# Patient Record
Sex: Male | Born: 2000 | Race: Black or African American | Hispanic: No | Marital: Single | Smoking: Never smoker
Health system: Southern US, Community
[De-identification: ages and names within clinical notes are randomized; demographics above are authoritative.]

---
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
## 2023-10-26 ENCOUNTER — Emergency Department (HOSPITAL_COMMUNITY): Payer: MEDICAID

## 2023-10-26 ENCOUNTER — Inpatient Hospital Stay (HOSPITAL_COMMUNITY): Payer: MEDICAID

## 2023-10-26 ENCOUNTER — Inpatient Hospital Stay (HOSPITAL_COMMUNITY)
Admission: EM | Admit: 2023-10-26 | Discharge: 2023-11-10 | DRG: 604 | Disposition: E | Payer: MEDICAID | Attending: General Surgery | Admitting: General Surgery

## 2023-10-26 DIAGNOSIS — R000 Tachycardia, unspecified: Secondary | ICD-10-CM | POA: Diagnosis present

## 2023-10-26 DIAGNOSIS — R402212 Coma scale, best verbal response, none, at arrival to emergency department: Secondary | ICD-10-CM | POA: Diagnosis present

## 2023-10-26 DIAGNOSIS — W3400XA Accidental discharge from unspecified firearms or gun, initial encounter: Secondary | ICD-10-CM

## 2023-10-26 DIAGNOSIS — S0193XA Puncture wound without foreign body of unspecified part of head, initial encounter: Principal | ICD-10-CM | POA: Diagnosis present

## 2023-10-26 DIAGNOSIS — Z66 Do not resuscitate: Secondary | ICD-10-CM | POA: Diagnosis present

## 2023-10-26 DIAGNOSIS — R402312 Coma scale, best motor response, none, at arrival to emergency department: Secondary | ICD-10-CM | POA: Diagnosis present

## 2023-10-26 DIAGNOSIS — G935 Compression of brain: Secondary | ICD-10-CM | POA: Diagnosis present

## 2023-10-26 DIAGNOSIS — R402431 Glasgow coma scale score 3-8, in the field [EMT or ambulance]: Secondary | ICD-10-CM

## 2023-10-26 DIAGNOSIS — I959 Hypotension, unspecified: Secondary | ICD-10-CM | POA: Diagnosis present

## 2023-10-26 DIAGNOSIS — Z515 Encounter for palliative care: Secondary | ICD-10-CM

## 2023-10-26 DIAGNOSIS — R0682 Tachypnea, not elsewhere classified: Secondary | ICD-10-CM | POA: Diagnosis present

## 2023-10-26 DIAGNOSIS — R569 Unspecified convulsions: Secondary | ICD-10-CM | POA: Diagnosis present

## 2023-10-26 DIAGNOSIS — R402112 Coma scale, eyes open, never, at arrival to emergency department: Secondary | ICD-10-CM | POA: Diagnosis present

## 2023-10-26 DIAGNOSIS — E872 Acidosis, unspecified: Secondary | ICD-10-CM | POA: Diagnosis present

## 2023-10-26 LAB — I-STAT ARTERIAL BLOOD GAS, ED
Acid-base deficit: 9 mmol/L — ABNORMAL HIGH (ref 0.0–2.0)
Bicarbonate: 16.8 mmol/L — ABNORMAL LOW (ref 20.0–28.0)
Calcium, Ion: 1.08 mmol/L — ABNORMAL LOW (ref 1.15–1.40)
HCT: 24 % — ABNORMAL LOW (ref 39.0–52.0)
Hemoglobin: 8.2 g/dL — ABNORMAL LOW (ref 13.0–17.0)
O2 Saturation: 100 %
Patient temperature: 99
Potassium: 3.4 mmol/L — ABNORMAL LOW (ref 3.5–5.1)
Sodium: 140 mmol/L (ref 135–145)
TCO2: 18 mmol/L — ABNORMAL LOW (ref 22–32)
pCO2 arterial: 37 mmHg (ref 32–48)
pH, Arterial: 7.266 — ABNORMAL LOW (ref 7.35–7.45)
pO2, Arterial: 242 mmHg — ABNORMAL HIGH (ref 83–108)

## 2023-10-26 LAB — COMPREHENSIVE METABOLIC PANEL WITH GFR
ALT: 18 U/L (ref 0–44)
AST: 32 U/L (ref 15–41)
Albumin: 3.1 g/dL — ABNORMAL LOW (ref 3.5–5.0)
Alkaline Phosphatase: 37 U/L — ABNORMAL LOW (ref 38–126)
Anion gap: 16 — ABNORMAL HIGH (ref 5–15)
BUN: 9 mg/dL (ref 6–20)
CO2: 18 mmol/L — ABNORMAL LOW (ref 22–32)
Calcium: 8.3 mg/dL — ABNORMAL LOW (ref 8.9–10.3)
Chloride: 104 mmol/L (ref 98–111)
Creatinine, Ser: 1.45 mg/dL — ABNORMAL HIGH (ref 0.61–1.24)
GFR, Estimated: >60 mL/min (ref >60)
Glucose, Bld: 197 mg/dL — ABNORMAL HIGH (ref 70–99)
Potassium: 2.8 mmol/L — ABNORMAL LOW (ref 3.5–5.1)
Sodium: 138 mmol/L (ref 135–145)
Total Bilirubin: 0.8 mg/dL (ref 0.0–1.2)
Total Protein: 5.4 g/dL — ABNORMAL LOW (ref 6.5–8.1)

## 2023-10-26 LAB — CBC
HCT: 23.6 % — ABNORMAL LOW (ref 39.0–52.0)
HCT: 37.3 % — ABNORMAL LOW (ref 39.0–52.0)
Hemoglobin: 11.4 g/dL — ABNORMAL LOW (ref 13.0–17.0)
Hemoglobin: 7.1 g/dL — ABNORMAL LOW (ref 13.0–17.0)
MCH: 28.4 pg (ref 26.0–34.0)
MCH: 28.7 pg (ref 26.0–34.0)
MCHC: 30.1 g/dL (ref 30.0–36.0)
MCHC: 30.6 g/dL (ref 30.0–36.0)
MCV: 93 fL (ref 80.0–100.0)
MCV: 95.5 fL (ref 80.0–100.0)
Platelets: 167 K/uL (ref 150–400)
Platelets: 202 K/uL (ref 150–400)
RBC: 2.47 MIL/uL — ABNORMAL LOW (ref 4.22–5.81)
RBC: 4.01 MIL/uL — ABNORMAL LOW (ref 4.22–5.81)
RDW: 12.3 % (ref 11.5–15.5)
RDW: 12.3 % (ref 11.5–15.5)
WBC: 14.5 K/uL — ABNORMAL HIGH (ref 4.0–10.5)
WBC: 9.6 K/uL (ref 4.0–10.5)
nRBC: 0.2 % (ref 0.0–0.2)
nRBC: 0.5 % — ABNORMAL HIGH (ref 0.0–0.2)

## 2023-10-26 LAB — BASIC METABOLIC PANEL WITH GFR
Anion gap: 13 (ref 5–15)
BUN: 9 mg/dL (ref 6–20)
CO2: 16 mmol/L — ABNORMAL LOW (ref 22–32)
Calcium: 7 mg/dL — ABNORMAL LOW (ref 8.9–10.3)
Chloride: 109 mmol/L (ref 98–111)
Creatinine, Ser: 1.91 mg/dL — ABNORMAL HIGH (ref 0.61–1.24)
GFR, Estimated: 50 mL/min — ABNORMAL LOW (ref >60)
Glucose, Bld: 299 mg/dL — ABNORMAL HIGH (ref 70–99)
Potassium: 2.9 mmol/L — ABNORMAL LOW (ref 3.5–5.1)
Sodium: 138 mmol/L (ref 135–145)

## 2023-10-26 LAB — URINALYSIS, ROUTINE W REFLEX MICROSCOPIC
Bilirubin Urine: NEGATIVE
Glucose, UA: NEGATIVE mg/dL
Hgb urine dipstick: NEGATIVE
Ketones, ur: NEGATIVE mg/dL
Leukocytes,Ua: NEGATIVE
Nitrite: NEGATIVE
Protein, ur: NEGATIVE mg/dL
Specific Gravity, Urine: 1.006 (ref 1.005–1.030)
pH: 7 (ref 5.0–8.0)

## 2023-10-26 LAB — I-STAT CHEM 8, ED
BUN: 7 mg/dL (ref 6–20)
Calcium, Ion: 1.08 mmol/L — ABNORMAL LOW (ref 1.15–1.40)
Chloride: 105 mmol/L (ref 98–111)
Creatinine, Ser: 1.6 mg/dL — ABNORMAL HIGH (ref 0.61–1.24)
Glucose, Bld: 194 mg/dL — ABNORMAL HIGH (ref 70–99)
HCT: 36 % — ABNORMAL LOW (ref 39.0–52.0)
Hemoglobin: 12.2 g/dL — ABNORMAL LOW (ref 13.0–17.0)
Potassium: 2.9 mmol/L — ABNORMAL LOW (ref 3.5–5.1)
Sodium: 143 mmol/L (ref 135–145)
TCO2: 20 mmol/L — ABNORMAL LOW (ref 22–32)

## 2023-10-26 LAB — SAMPLE TO BLOOD BANK

## 2023-10-26 LAB — I-STAT CG4 LACTIC ACID, ED: Lactic Acid, Venous: 8.6 mmol/L (ref 0.5–1.9)

## 2023-10-26 LAB — PROTIME-INR
INR: 1.4 — ABNORMAL HIGH (ref 0.8–1.2)
Prothrombin Time: 17.6 s — ABNORMAL HIGH (ref 11.4–15.2)

## 2023-10-26 LAB — HIV ANTIBODY (ROUTINE TESTING W REFLEX): HIV Screen 4th Generation wRfx: NONREACTIVE

## 2023-10-26 LAB — ETHANOL: Alcohol, Ethyl (B): 145 mg/dL — ABNORMAL HIGH (ref <15)

## 2023-10-26 MED ORDER — POTASSIUM CHLORIDE 20 MEQ PO PACK: 40.0000 meq | PACK | Freq: Once | ORAL | Status: DC

## 2023-10-26 MED ORDER — PROPOFOL 1000 MG/100ML IV EMUL
0.0000 ug/kg/min | INTRAVENOUS | Status: DC
  Administered 2023-10-26: 10 ug/kg/min via INTRAVENOUS

## 2023-10-26 MED ORDER — SODIUM CHLORIDE 0.9 % IV BOLUS
1000.0000 mL | Freq: Once | INTRAVENOUS | Status: AC
  Administered 2023-10-26: 1000 mL via INTRAVENOUS

## 2023-10-26 MED ORDER — POTASSIUM CHLORIDE 10 MEQ/100ML IV SOLN: 10.0000 meq | INTRAVENOUS | Status: AC

## 2023-10-26 MED ORDER — PHENYLEPHRINE HCL-NACL 20-0.9 MG/250ML-% IV SOLN
INTRAVENOUS | Status: AC
  Administered 2023-10-26: 20 ug/min via INTRAVENOUS
  Filled 2023-10-26: qty 250

## 2023-10-26 MED ORDER — ONDANSETRON HCL 4 MG/2ML IJ SOLN: 4.0000 mg | Freq: Four times a day (QID) | INTRAMUSCULAR | Status: DC | PRN

## 2023-10-26 MED ORDER — DOCUSATE SODIUM 100 MG PO CAPS: 100.0000 mg | ORAL_CAPSULE | Freq: Two times a day (BID) | ORAL | Status: DC

## 2023-10-26 MED ORDER — HYDRALAZINE HCL 20 MG/ML IJ SOLN: 10.0000 mg | INTRAMUSCULAR | Status: DC | PRN

## 2023-10-26 MED ORDER — POLYETHYLENE GLYCOL 3350 17 G PO PACK: 17.0000 g | PACK | Freq: Every day | ORAL | Status: DC

## 2023-10-26 MED ORDER — POLYETHYLENE GLYCOL 3350 17 G PO PACK: 17.0000 g | PACK | Freq: Every day | ORAL | Status: DC | PRN

## 2023-10-26 MED ORDER — NOREPINEPHRINE 4 MG/250ML-% IV SOLN
INTRAVENOUS | Status: AC
  Filled 2023-10-26: qty 250

## 2023-10-26 MED ORDER — SUCCINYLCHOLINE CHLORIDE 20 MG/ML IJ SOLN
INTRAMUSCULAR | Status: AC | PRN
  Administered 2023-10-26: 80 mg via INTRAVENOUS

## 2023-10-26 MED ORDER — PROPOFOL 1000 MG/100ML IV EMUL
INTRAVENOUS | Status: AC
  Filled 2023-10-26: qty 100

## 2023-10-26 MED ORDER — DOCUSATE SODIUM 50 MG/5ML PO LIQD: 100.0000 mg | Freq: Two times a day (BID) | ORAL | Status: DC

## 2023-10-26 MED ORDER — ACETAMINOPHEN 500 MG PO TABS: 1000.0000 mg | ORAL_TABLET | Freq: Four times a day (QID) | ORAL | Status: DC

## 2023-10-26 MED ORDER — FENTANYL CITRATE PF 50 MCG/ML IJ SOSY
25.0000 ug | PREFILLED_SYRINGE | Freq: Once | INTRAMUSCULAR | Status: AC
  Administered 2023-10-26: 25 ug via INTRAVENOUS

## 2023-10-26 MED ORDER — ONDANSETRON 4 MG PO TBDP: 4.0000 mg | ORAL_TABLET | Freq: Four times a day (QID) | ORAL | Status: DC | PRN

## 2023-10-26 MED ORDER — METOPROLOL TARTRATE 5 MG/5ML IV SOLN: 5.0000 mg | Freq: Four times a day (QID) | INTRAVENOUS | Status: DC | PRN

## 2023-10-26 MED ORDER — SODIUM CHLORIDE 0.9 % IV SOLN: INTRAVENOUS | Status: DC

## 2023-10-26 MED ORDER — FENTANYL CITRATE PF 50 MCG/ML IJ SOSY: 100.0000 ug | PREFILLED_SYRINGE | Freq: Once | INTRAMUSCULAR | Status: DC

## 2023-10-26 MED ORDER — METHOCARBAMOL 500 MG PO TABS: 500.0000 mg | ORAL_TABLET | Freq: Three times a day (TID) | ORAL | Status: DC

## 2023-10-26 MED ORDER — FENTANYL BOLUS VIA INFUSION: 25.0000 ug | INTRAVENOUS | Status: DC | PRN

## 2023-10-26 MED ORDER — METHOCARBAMOL 1000 MG/10ML IJ SOLN: 500.0000 mg | Freq: Three times a day (TID) | INTRAMUSCULAR | Status: DC

## 2023-10-26 MED ORDER — FENTANYL 2500MCG IN NS 250ML (10MCG/ML) PREMIX INFUSION
0.0000 ug/h | INTRAVENOUS | Status: DC
  Administered 2023-10-26: 25 ug/h via INTRAVENOUS
  Filled 2023-10-26: qty 250

## 2023-10-26 MED ORDER — ETOMIDATE 2 MG/ML IV SOLN
INTRAVENOUS | Status: AC | PRN
  Administered 2023-10-26: 20 mg via INTRAVENOUS

## 2023-10-26 MED ORDER — SODIUM BICARBONATE 8.4 % IV SOLN
INTRAVENOUS | Status: DC
  Filled 2023-10-26: qty 150

## 2023-10-26 MED ORDER — LEVETIRACETAM (KEPPRA) 500 MG/5 ML ADULT IV PUSH
2000.0000 mg | Freq: Once | INTRAVENOUS | Status: AC
  Administered 2023-10-26: 2000 mg via INTRAVENOUS

## 2023-10-26 MED ORDER — PHENYLEPHRINE HCL-NACL 20-0.9 MG/250ML-% IV SOLN: 0.0000 ug/min | INTRAVENOUS | Status: DC

## 2023-10-26 MED ORDER — LACTATED RINGERS IV BOLUS
1000.0000 mL | Freq: Once | INTRAVENOUS | Status: AC
Start: 2023-10-26 — End: 2023-10-26
  Administered 2023-10-26: 1000 mL via INTRAVENOUS
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
## 2023-11-10 NOTE — ED Notes (Signed)
 Family at bedside.
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
## 2023-11-10 NOTE — ED Notes (Signed)
 Pt intubated by Dr. Melvenia, 26 at the lip.

## 2023-11-10 NOTE — ED Notes (Signed)
Pt has returned from CT without incident 
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
## 2023-11-10 NOTE — Progress Notes (Signed)
..  Trauma Event Note    Reason for Call :  Adoptive mother Zane Kitty [phone] [email or web address], provided documentation where she obtained guardianship and adopted pt after his grandmother and parents passed away. Hard copies in chart.    Last imported Vital Signs BP (!) 34/25   Pulse (!) 126   Temp 97.7 F (36.5 C)   Resp (!) 0   Ht 6' 2 (1.88 m)   Wt 165 lb 5.5 oz (75 kg)   SpO2 (!) 88%   BMI 21.23 kg/m   Trending CBC Recent Labs    10/12/2023 0026 10/17/2023 0031 11/08/2023 0154 11/06/2023 0319  WBC 9.6  --   --  14.5*  HGB 11.4* 12.2* 8.2* 7.1*  HCT 37.3* 36.0* 24.0* 23.6*  PLT 202  --   --  167    Trending Coag's Recent Labs    11/09/2023 0026  INR 1.4*    Trending BMET Recent Labs    10/30/2023 0026 10/22/2023 0031 11/04/2023 0154 11/05/2023 0319  NA 138 143 140 138  K 2.8* 2.9* 3.4* 2.9*  CL 104 105  --  109  CO2 18*  --   --  16*  BUN 9 7  --  9  CREATININE 1.45* 1.60*  --  1.91*  GLUCOSE 197* 194*  --  299*      Yuan Gann Dee  Trauma Response RN  Please call TRN at [phone] for further assistance.
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
## 2023-11-10 NOTE — ED Notes (Signed)
 Patient transported to CT

## 2023-11-10 NOTE — Progress Notes (Signed)
 Patient ID: Victor [name], male   DOB: December 07, 2000, 23 y.o.   MRN: [id number] We stopped by to see the patient.  He is GCS 3, pupils 5 mm and irregular and nonreactive, he is on fentanyl  and Neo-Synephrine.  Pressure 60/30.  I reviewed his CT of the head once again.  I simply do not think there is any surgical treatment that would be indicated as I think all treatment is futile.  I think this is not a survivable injury.

## 2023-11-10 NOTE — Progress Notes (Signed)
 Orthopedic Tech Progress Note Patient Details:  [name] December 01, 2000 [id number]  Patient ID: Victor [name], male   DOB: 03/19/2000, 23 y.o.   MRN: [id number] I attended trauma page. Chandra Dorn PARAS 11/09/2023, 12:50 AM

## 2023-11-10 NOTE — ED Notes (Signed)
 Brown paper bags placed and wrapped around each hand.

## 2023-11-10 NOTE — Progress Notes (Signed)
 [facility] Ref completed Ref [id number] Dorothe Pouch
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
## 2023-11-10 NOTE — Progress Notes (Signed)
   10/19/2023 0030  Vent Select  $ Ventilator Initial/Subsequent  Initial  Invasive or Noninvasive Invasive  Adult Vent Y  Vent start date 10/20/2023  Vent start time 0030  Airway 7.5 mm  Placement Date/Time: 10/13/2023 0030   Grade View: Grade 3  Placed By: ED Physician  ETT Types: Endobronchial;Oral  Size (mm): 7.5 mm  Cuffed: Cuffed  Insertion attempts: 1  Airway Equipment: Stylet  Placement Confirmation: (c) Direct Visualization;ETCO...  Secured at (cm) 26 cm  Measured From Lips  Secured Location Center  Secured By Commercial Tube Holder  Tube Holder Repositioned Yes  Cuff Pressure (cm H2O) Green OR 18-26 CmH2O  Site Condition Cool  Adult Ventilator Settings  Vent Type Servo i  Humidity HME  Vent Mode PRVC  Vt Set 400 mL  Set Rate 18 bmp  FiO2 (%) 100 %  I Time 0.9 Sec(s)  PEEP 10 cmH20  Adult Ventilator Measurements  Peak Airway Pressure 17 L/min  Mean Airway Pressure 10 cmH20  Resp Rate Spontaneous 0 br/min  Resp Rate Total 18 br/min  Exhaled Vt 380 mL  Measured Ve 7.1 L  I:E Ratio Measured 1:2.7  Auto PEEP 0 cmH20  Total PEEP 5 cmH20  Adult Ventilator Alarms  Alarms On Y  Ve High Alarm 20 L/min  Ve Low Alarm 3 L/min  Resp Rate High Alarm 40 br/min  Resp Rate Low Alarm 8  PEEP Low Alarm 2 cmH2O  Press High Alarm 40 cmH2O  VAP Prevention  HOB> 30 Degrees Y   Pt was intubated and placed on vent
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
## 2023-11-10 NOTE — Progress Notes (Signed)
 Pharmacy Electrolyte Replacement  Recent Labs:  Recent Labs    10/11/2023 0031  K 2.9*  CREATININE 1.60*    Low Critical Values (K </= 2.5, Phos </= 1, Mg </= 1) Present: K = 2.9  MD Contacted: per protocol  Plan: K 40 mEq per tube + 40 mEq IV x4 per protocol   Lynwood Poplar, PharmD, BCPS Clinical Pharmacist 11/06/2023 1:03 AM
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
## 2023-11-10 DEATH — deceased
